# Patient Record
Sex: Female | Born: 1935 | Race: White | Hispanic: No
Health system: Southern US, Community
[De-identification: ages and names within clinical notes are randomized; demographics above are authoritative.]

---

## 1998-02-16 ENCOUNTER — Ambulatory Visit (HOSPITAL_COMMUNITY): Admission: RE | Admit: 1998-02-16 | Discharge: 1998-02-16 | Payer: Self-pay | Admitting: Specialist

## 1998-03-18 ENCOUNTER — Ambulatory Visit (HOSPITAL_COMMUNITY): Admission: RE | Admit: 1998-03-18 | Discharge: 1998-03-18 | Payer: Self-pay | Admitting: Specialist

## 2003-04-09 ENCOUNTER — Other Ambulatory Visit: Payer: Self-pay

## 2003-04-17 ENCOUNTER — Inpatient Hospital Stay (HOSPITAL_COMMUNITY)
Admission: AD | Admit: 2003-04-17 | Discharge: 2003-05-13 | Payer: Self-pay | Admitting: Physical Medicine & Rehabilitation

## 2005-02-22 ENCOUNTER — Other Ambulatory Visit: Payer: Self-pay

## 2005-02-22 ENCOUNTER — Inpatient Hospital Stay: Payer: Self-pay | Admitting: Internal Medicine

## 2005-02-23 ENCOUNTER — Other Ambulatory Visit: Payer: Self-pay

## 2005-02-24 ENCOUNTER — Other Ambulatory Visit: Payer: Self-pay

## 2012-07-27 ENCOUNTER — Ambulatory Visit: Payer: Self-pay | Admitting: Orthopedic Surgery

## 2012-07-28 ENCOUNTER — Inpatient Hospital Stay: Payer: Self-pay

## 2012-07-28 LAB — URINALYSIS, COMPLETE
Ketone: NEGATIVE
Nitrite: POSITIVE
Ph: 6 (ref 4.5–8.0)
Protein: NEGATIVE
RBC,UR: 3 /HPF (ref 0–5)
WBC UR: 12 /HPF (ref 0–5)

## 2012-07-28 LAB — COMPREHENSIVE METABOLIC PANEL
Alkaline Phosphatase: 97 U/L (ref 50–136)
Anion Gap: 10 (ref 7–16)
BUN: 26 mg/dL — ABNORMAL HIGH (ref 7–18)
Bilirubin,Total: 0.2 mg/dL (ref 0.2–1.0)
Calcium, Total: 9.1 mg/dL (ref 8.5–10.1)
Co2: 24 mmol/L (ref 21–32)
EGFR (Non-African Amer.): 43 — ABNORMAL LOW
Potassium: 3.7 mmol/L (ref 3.5–5.1)
SGPT (ALT): 16 U/L (ref 12–78)
Sodium: 137 mmol/L (ref 136–145)
Total Protein: 7 g/dL (ref 6.4–8.2)

## 2012-07-28 LAB — TROPONIN I: Troponin-I: <0.02 ng/mL

## 2012-07-28 LAB — PROTIME-INR
INR: 1
Prothrombin Time: 13 secs (ref 11.5–14.7)

## 2012-07-28 LAB — CBC
HGB: 12 g/dL (ref 12.0–16.0)
MCH: 29.3 pg (ref 26.0–34.0)
MCHC: 34 g/dL (ref 32.0–36.0)
RBC: 4.11 10*6/uL (ref 3.80–5.20)
RDW: 14.8 % — ABNORMAL HIGH (ref 11.5–14.5)
WBC: 8.1 10*3/uL (ref 3.6–11.0)

## 2012-07-31 LAB — PATHOLOGY REPORT

## 2012-07-31 LAB — POTASSIUM: Potassium: 4.1 mmol/L (ref 3.5–5.1)

## 2013-05-15 ENCOUNTER — Ambulatory Visit: Payer: Self-pay

## 2013-06-12 DEATH — deceased

## 2014-06-08 IMAGING — CT CT ANGIO CHEST
2 of 6 series · 18 of 36 positions shown · IV contrast (APPLIED)
Comparison: Chest CTA 02/22/2005.

CLINICAL DATA: [AGE] female with chest mass, pulmonary
nodules. Initial encounter.

EXAM:
CT ANGIOGRAPHY CHEST WITH CONTRAST
TECHNIQUE: Multidetector CT imaging of the chest was performed using the
standard protocol during bolus administration of intravenous
contrast. Multiplanar CT image reconstructions and MIPs were
obtained to evaluate the vascular anatomy.
CONTRAST:  75 mL Isovue 370.

[Series 6: pe 1.0 thins · axial · 0.79mm/px · z∈[-584,-300]mm · 17 of 320 slices shown]
[im 18/320  lung]
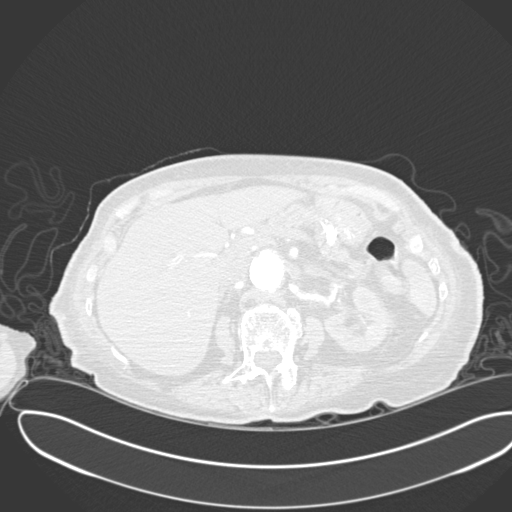
[im 36/320  mediastinal]
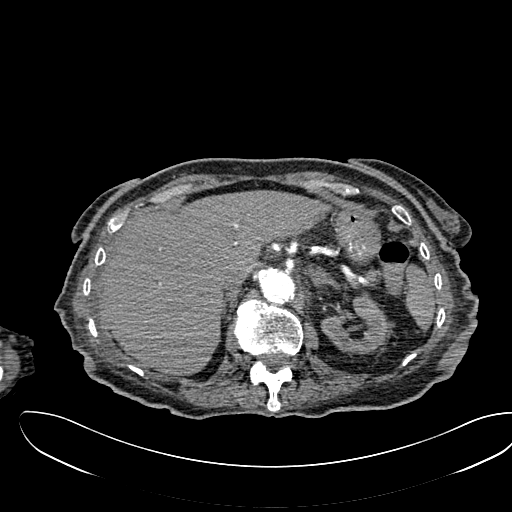
[im 54/320  lung]
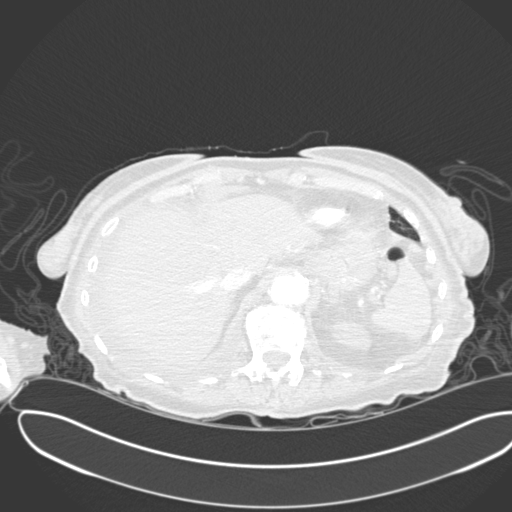
[im 71/320  mediastinal]
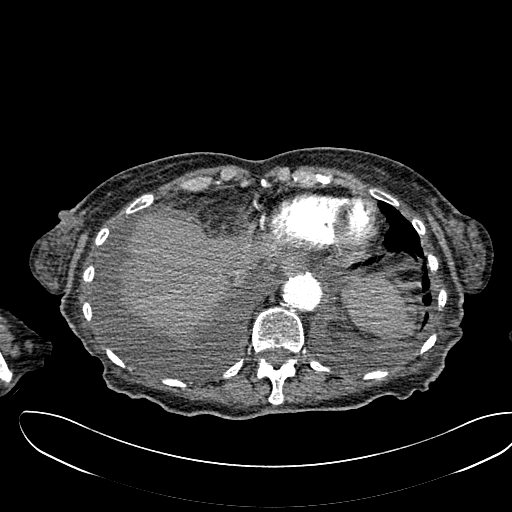
[im 89/320  lung]
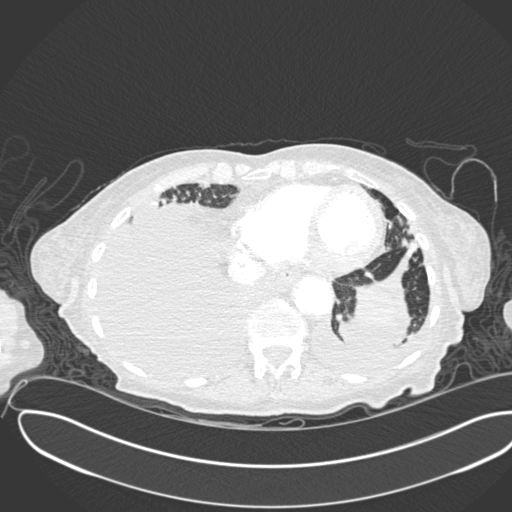
[im 107/320  mediastinal]
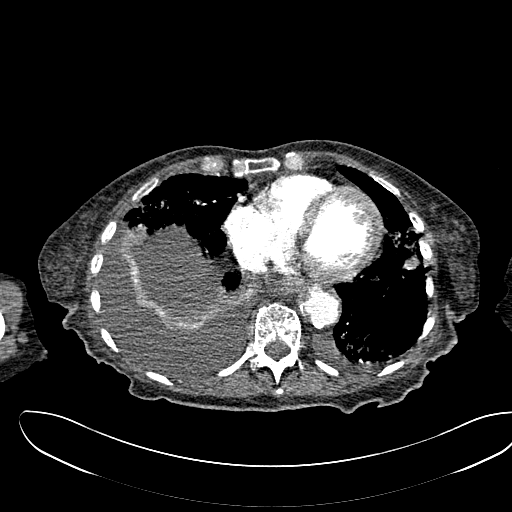
[im 125/320  lung]
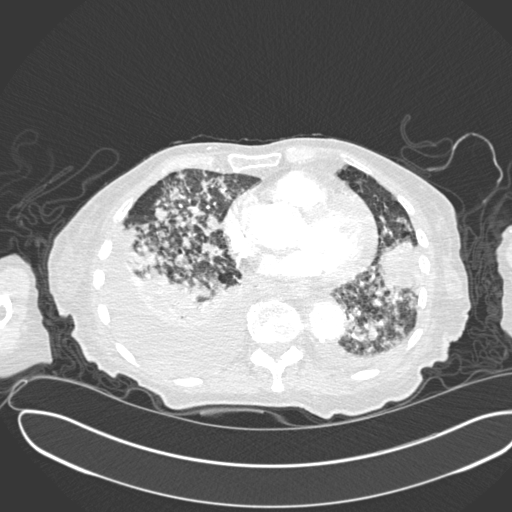
[im 142/320  mediastinal]
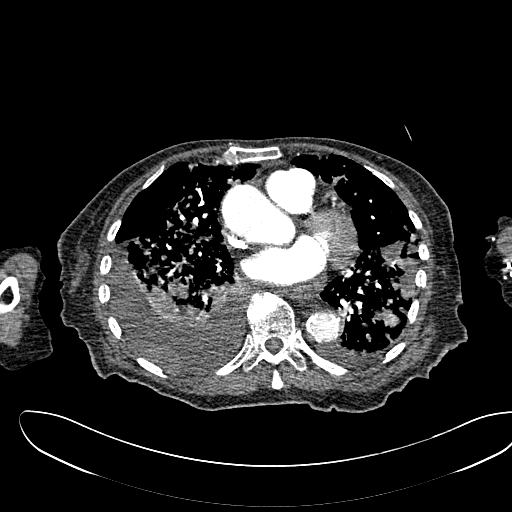
[im 160/320  lung]
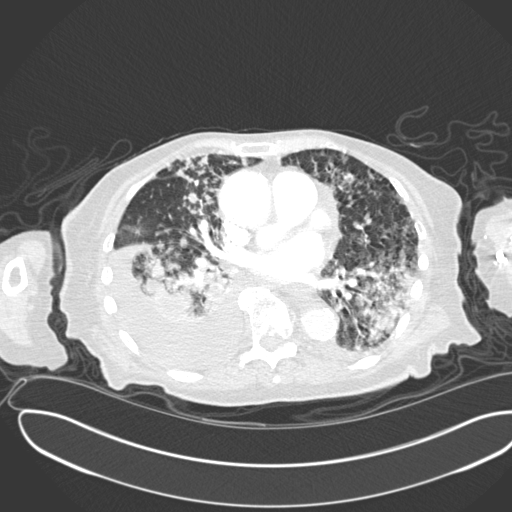
[im 178/320  mediastinal]
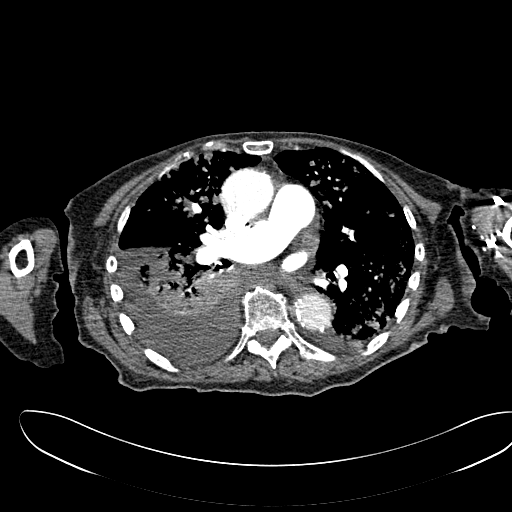
[im 195/320  lung]
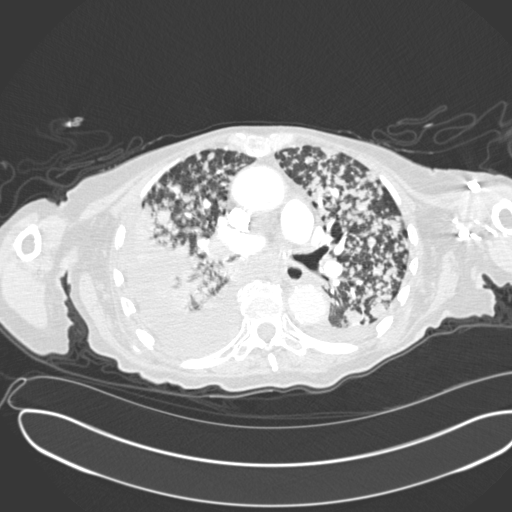
[im 213/320  mediastinal]
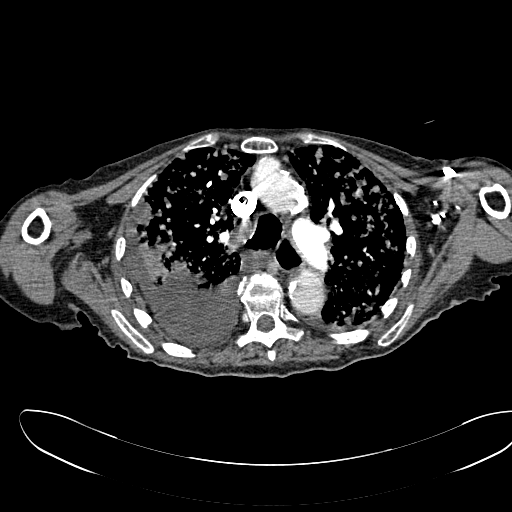
[im 231/320  lung]
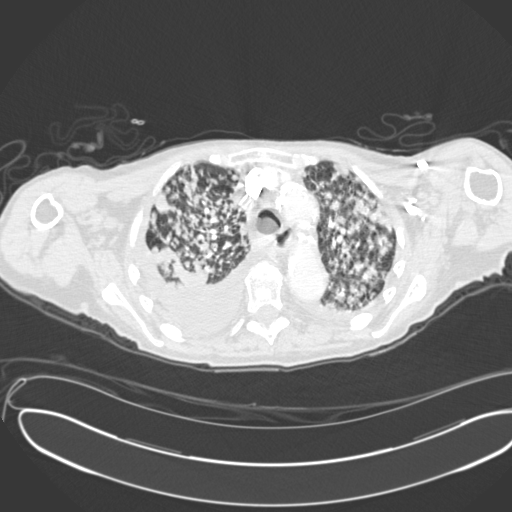
[im 249/320  mediastinal]
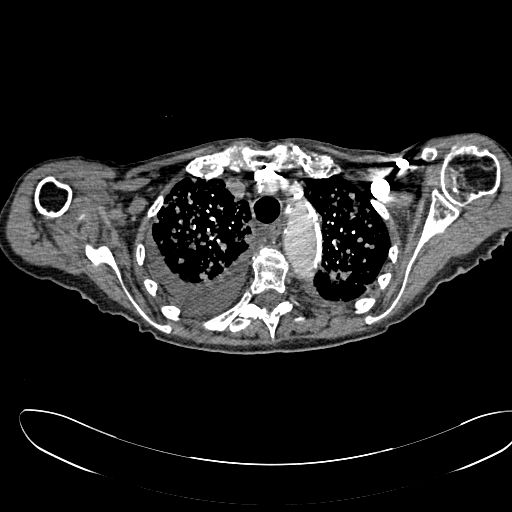
[im 266/320  lung]
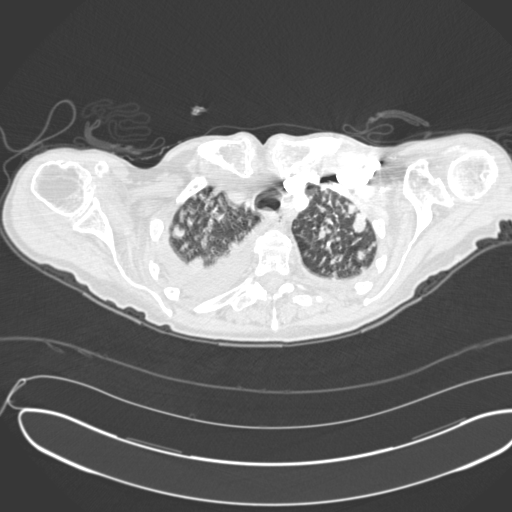
[im 284/320  mediastinal]
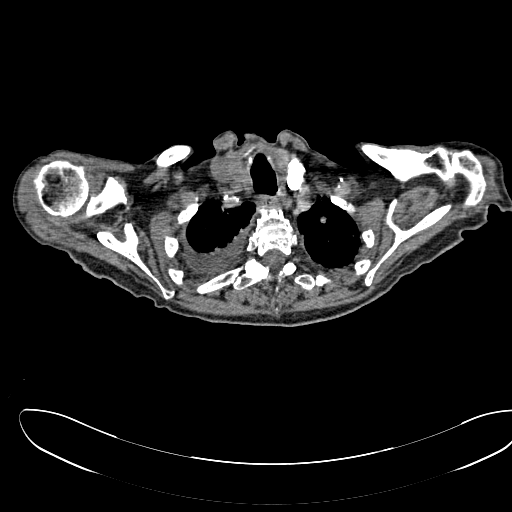
[im 302/320  lung]
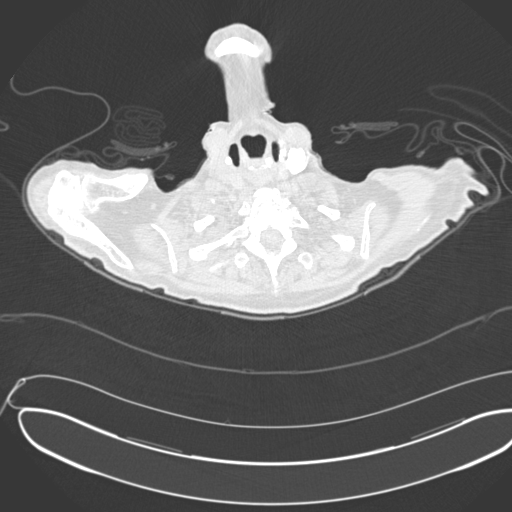

[Series 8: cor pe 2.0 mpr · coronal · 0.73mm/px · 1 of 117 slices shown]
[im 59/117  mediastinal]
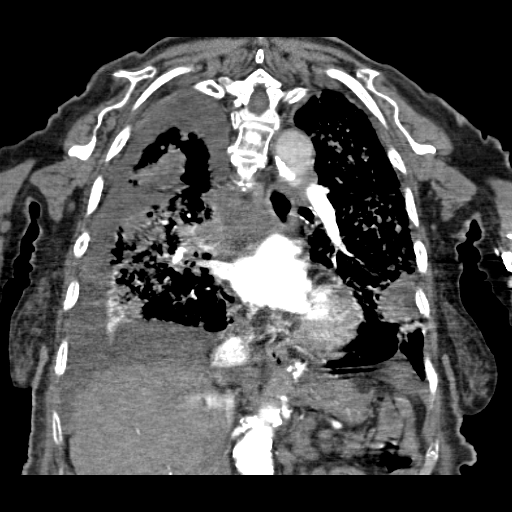

[18 of 36 positions shown; findings below may reference images not displayed]

FINDINGS: Good contrast bolus timing in the pulmonary arterial tree.

No focal filling defect identified in the pulmonary arterial tree to
suggest the presence of acute pulmonary embolism.

Large layering right pleural effusion. Subtle 3 cm pleural based
mass posteriorly at the mid lung level (series 5, image 95). Due to
the effusion on the right, subtotal right lung collapse. Small to
moderate layering left pleural effusion as well.

Superimposed too numerous to count diffuse pulmonary nodules and
soft tissue masses. These are confluent in areas (up to 4-5 cm at
the left lung base, series 7, image 94).

Superimposed atelectatic changes on the central airways.

Suspect subcarinal mediastinal lymphadenopathy. Mildly increased
right hilar lymph nodes. No definite mediastinal or hilar adenopathy
elsewhere. No pericardial effusion.

No lymphadenopathy at the thoracic inlet. Widespread atherosclerosis
of the aorta. Infrarenal abdominal aortic aneurysm partially
visible, visualized portion up to 38 mm diameter.

No axillary lymphadenopathy.

Except for suspected additional right pleural based tumor along the
surface of the right hemidiaphragm, no mass or tumor identified in
the visible upper abdomen.

No acute or suspicious osseous lesion identified.

Review of the MIP images confirms the above findings.
IMPRESSION: 1. Advanced thoracic malignancy in the form of innumerable pulmonary
metastases, unknown primary. Pleural based metastases suspected on
the right with subsequent large right pleural effusion. Some hilar
and mediastinal lymphadenopathy.
2. Right lower lung collapse related to the large right effusion.
Moderate layering left pleural effusion.
3. No pulmonary embolus.
4. Incidental findings including partially visible AAA.
Study discussed by telephone with nurse practitioner Shukria Mtm

## 2014-07-04 NOTE — Op Note (Signed)
PATIENT NAME:  Susan Washington, Susan Washington MR#:  [id number] DATE OF BIRTH:  09-Sep-1917  DATE OF PROCEDURE:  07/28/2012  PREOPERATIVE DIAGNOSIS: Right displaced femoral neck fracture.   POSTOPERATIVE DIAGNOSIS: Right displaced femoral neck fracture.   PROCEDURE PERFORMED: Right hip hemiarthroplasty.   SURGEON OF RECORD: Dr. Thornton Papas    ANESTHESIA: General endotracheal tube anesthesia.   Preoperative Antibiotics: 1 gram Ancef.   FLUIDS ADMINISTERED: 1200 crystalloid.   ESTIMATED BLOOD LOSS: 250 mL.   COMPLICATIONS: None.   DISPOSITION: Stable to PACU upon transport.   INDICATIONS FOR PROCEDURE: This is a 81 year old nursing home female who is a moderate household ambulator with walker who fell and sustained a right femoral neck fracture. The patient and her son were counseled regarding risks and benefits of the procedure, the risks to include but not limited to bleeding, infection, damage to sooner and/or vessels, dislocation, fracture, need for additional surgical procedures. They agreed to proceed with surgery after weighing all these risks.   DESCRIPTION OF PROCEDURE: The patient was brought to the operating room and placed on the operating room table in the lateral position on a pegboard. X-roll was placed and all extremities were appropriately padded. The right lower extremity was then prepped and draped in a sterile fashion. A timeout was then performed between the anesthesia, nursing staff and surgeon of record to confirm surgical site, patient, medical record number, date of birth, laterality, procedure to be performed, confirmation of administration of antibiotics, confirmation all equipment was in the room. We next began making a posterior approach to the right hip with dissection down to the level of the fascia. The fascia was incised, then blunt dissection down to the level of the piriformis and short external rotators. These were reflected off their insertion and tagged with Ethibond  suture for later repair. With a Cobra retractor underneath the hip abductors, the capsulotomy was performed and this was also tagged with an Ethibond suture for later repair. At this point, the neck cut was revised to facilitate removal of the femoral head. The head was removed and measured to be approximately 45 mm in diameter. We trialed the 45 head in the socket and we were happy with the fit. We next used our canal entry reamer to find the canal and then used the lateralizer to help lateralize our trajectory. We then began sequentially broaching starting with a size 1 but progressing to a size 3 and had a good stable fit. We used the calcar planer to plane down her neck cut and then trialed with a 45 + 0. This was felt to be slightly loose, so we trialed with a 45 + 5 and we were happy with both range of motion and stability. The 3 broach was then removed. The cement restrictor size 3 was placed at the appropriate depth in the canal and the canal was cleaned out with combination of pulse irrigation and femoral brush to cleanse the canal. At this point, the cement was prepared and then injected down the canal with the assistance of the cement pressurizer. We then inserted our size 3 stem, removed the excess cement, and waited approximately 17 minutes for the cement to cure. At this point, we once again trialed the 45 + 5 and were happy with both range of motion and stability. Final head implant was then placed and secured with the [facility] taper. The hip was reduced and taken through range of motion, again confirming stability and motion. The wound was then copiously irrigated  with sterile saline and the capsule was repaired through a drill hole drill hole in the greater trochanter using #5 Ethibond suture. The piriformis short external rotators were also brought up to the insertion of the abductors to augment the posterior repair. The hip was maintained in an abducted position on a padded Mayo stand to during the  entire posterior closure repair. We next used 0 Vicryl to close the fascia followed by 2-0 Vicryl and then staples in the skin. Sterile dressing was applied and patient was placed on an abduction pillow and transferred to PACU in stable condition.    ____________________________ Danelle Earthlyobin T. Shandora Koogler, MD tte:aw D: 07/28/2012 13:46:06 ET T: 07/29/2012 07:52:31 ET JOB#: [id number]  cc: Danelle Earthlyobin T. Shrinika Blatz, MD, <Dictator> Danelle EarthlyBIN T Ronasia Isola MD ELECTRONICALLY SIGNED 07/30/2012 21:00

## 2014-07-04 NOTE — Discharge Summary (Signed)
PATIENT NAME:  Susan Washington, Susan Washington MR#:  [id number] DATE OF BIRTH:  06-21-1917  DATE OF ADMISSION:  07/28/2012 DATE OF DISCHARGE:    ADMITTING DIAGNOSIS: Right displaced femoral neck fracture.   DISCHARGE DIAGNOSIS: Right displaced femoral neck fracture.   PROCEDURE: Right hip hemiarthroplasty.   SURGEON: Dr. Thornton Papas.   ANESTHESIA: General endotracheal tube anesthesia.   PREOPERATIVE ANTIBIOTICS: 1 gram Ancef.   IV FLUIDS: 1200 crystalloid.   ESTIMATED BLOOD LOSS: 250 mL.   COMPLICATIONS: None.   DISPOSITION: Stable to PACU upon transport.   HISTORY: The patient is a 81 year old nursing home resident. He is a minimal ambulator with walker. He fell and sustained a right femoral neck fracture.   PHYSICAL EXAMINATION:  RESPIRATORY: Normal respiratory effort.  CARDIOLOGY: Regular rate.  EXTREMITIES: Right lower extremity shortened and externally rotated.   HOSPITAL COURSE: The patient was admitted to the hospital on 07/28/2012. She had surgery that same day and was brought to the orthopedic floor from the PACU in stable condition. On postop day 1, the patient was doing well. Vital signs and labs were stable. On postop day 2, she continued to be stable. She did have an increased potassium of 5.2 on postop day 2. On postop day 2, fluids and potassium supplements were stopped and she was found to have a normal potassium on postop day 3. The patient's labs remained stable. She had slow progress with physical therapy. She was ready for discharge to a rehab facility.   DISCHARGE INSTRUCTIONS:  1.  The patient should increase weight-bearing on the affected extremity. She is to wear thigh-high TED hose on both legs and remove at bedtime and replace when arising the next morning. 2.  Elevate the heels off the bed.  3.  She may resume a regular diet as tolerated.  4.  She is to apply an ice pack to the affected area.  5.  Do not get the dressing bandage wet or dirty.  6.  Call [facility] at the  office if the dressing gets water under it. Leave the dressing on.  7.  Remove staples and apply benzoin Steri-Strips on 08/14/2012.  8.  Call [facility] if any of the following occur such as bright red bleeding from the incision wound, fever above 101.5 degrees, redness or swelling, or drainage at the incision.  9.  Call [facility] if you experience any increased leg pain, numbness or weakness in legs or bowel or bladder symptoms.  10. The patient is referred physical therapy and occupational therapy at the rehab facility. She should have staples removed on 08/14/2012 at the rehab facility. 11. Call [facility] for followup appointment at [phone]. She should have appointment in 6 weeks.  12. Followup physician, Kennedy Bucker, M.D.   DISCHARGE MEDICATIONS: Remeron 30 mg 1 tablet orally once a day, Aggrenox one cap orally 2 times a day, atorvastatin 10 mg oral tablet 1 tablet orally once a day at bedtime, Diovan 80 mg oral tablet 1 tablet orally once a day, Effexor 37.5 mg oral capsule extended release 1 cap orally once a day, furosemide 20 mg oral tablet 1 tablet orally once a day, isosorbide mononitrate 20 mg oral tablet 1 tablet orally once a day, metoprolol succinate 50 mg oral tablet extended release 1 tablet orally once a day, Prilosec 20 mg oral delayed-release capsule one cap orally once a day, Senokot plus 2 tabs orally 2 times a day, Claritin 10 mg oral tablet 1 tab orally once a day  as needed, Xanax 0.25 mg oral tablet 1 tablet orally every 8 hours as needed for anxiety and nervousness, erythromycin ophthalmic 0.5% ophthalmic ointment one application to each affected eye once a day at bedtime, D3 50, 50,000 international units oral capsule one cap orally once a month, mirtazapine 50 mg oral tablet 1 tablet orally once a day at bedtime, albuterol 2.5 mg every 4 hours as needed for shortness of breath, senna 2 tabs orally 3 times a day, bisacodyl 5 mg oral delayed release tablet 2 tabs orally once a day at  bedtime, polyethylene glycol 350 oral powder for reconstitution 17 grams orally once a day as needed for constipation, erythromycin 1 application every 24 hours and Lovenox 30 mg subcutaneous once a day for 14 days.  ____________________________ Evon Slackhomas C. Spiros Greenfeld, PA-C tcg:aw D: 07/31/2012 10:11:28 ET T: 07/31/2012 10:23:40 ET JOB#: [id number]  cc: Evon Slackhomas C. Felicidad Sugarman, PA-C, <Dictator> Evon SlackHOMAS C Angee Gupton GeorgiaPA ELECTRONICALLY SIGNED 08/19/2012 2:37

## 2014-07-04 NOTE — H&P (Signed)
   Subjective/Chief Complaint Right hip pain   History of Present Illness 81 y/o nursing home resident who is a minimal ambulator with walker who fell and sustained a right femoral neck fracture.   Past History CVA   Past Med/Surgical Hx:  CVA/Stroke:   ALLERGIES:  Quinolones (fluoroquinolone) antibiotics: Unknown  Family and Social History:  Family History Non-Contributory   Place of Living Nursing Home   Review of Systems:  Subjective/Chief Complaint hip pain   Fever/Chills No   Abdominal Pain No   Chest Pain No   Physical Exam:  RESP normal resp effort   CARD regular rate   EXTR RLE shortened, externally rotated    Assessment/Admission Diagnosis Right femoral neck fracture   Plan Admit and obtain medical clearance/optimization Plan for hemiarthroplasty when medically cleared   Electronic Signatures: Danelle EarthlyEckel, Tobin T (MD)  (Signed 17-May-14 06:00)  Authored: CHIEF COMPLAINT and HISTORY, PAST MEDICAL/SURGIAL HISTORY, ALLERGIES, FAMILY AND SOCIAL HISTORY, REVIEW OF SYSTEMS, PHYSICAL EXAM, ASSESSMENT AND PLAN   Last Updated: 17-May-14 06:00 by Danelle EarthlyEckel, Tobin T (MD)

## 2014-07-04 NOTE — Consult Note (Signed)
PATIENT NAME:  Susan Washington, MORT MR#:  [id number] DATE OF BIRTH:  06/01/1917  DATE OF CONSULTATION:  07/28/2012  REFERRING PHYSICIAN: Danelle Earthly, MD  CONSULTING PHYSICIAN:  Susa Griffins, MD  PRIMARY CARE PHYSICIAN: The patient lives in a nursing home.   REASON FOR CONSULTATION: Preoperative clearance for surgery.  HISTORY OF PRESENT ILLNESS: Ms. Skillman is a 81 year old pleasant white female with a past medical history of CVA, congestive heart failure, previous history of non-Q-wave MI, hypertension, hyperlipidemia, cardiomyopathy, who presented to the Emergency Department with complaints of left hip pain after having a fall. The patient states was sleeping, rolled out of bed and fell on the left hip. Started to experience severe pain. Workup in the Emergency Department showed right femoral neck fracture. The patient has history of CVA with residual weakness. The patient has some slurred speech. Denies having any chest pain, palpitations. Denies having any shortness of breath with exertion. The patient usually walks with the help of a walker to the dining room. Dependent on all the ADLs.    PAST MEDICAL HISTORY:  1. Hypertension.  2. Hyperlipidemia.  3. History of cardiomyopathy.  4. Congestive heart failure.  5. Depression.  6. History of CVA with the residual hemiparesis on the right.   PAST SURGICAL HISTORY: Tonsillectomy.   ALLERGIES: QUINOLONE.   HOME MEDICATIONS:  1. Sorbitol 30 mL orally as needed.  2. Senokot 1 tablet 2 times a day.  3. Ritalin 10 mg once a day.  4. Remeron 30 mg once a day. 5. Multivitamin 1 tablet once a day.  6. Lacri-Lube apply to the right eye.  7. Hydrochlorothiazide 12.5 mg daily.  8. Crestor 10 mg daily.  9. Avapro 300 mg daily.  10. Aspirin 325 mg daily.   SOCIAL HISTORY: No history of smoking, drinking alcohol or using illicit drugs.   FAMILY HISTORY: Noncontributory.    REVIEW OF SYSTEMS:  CONSTITUTIONAL: No generalized weakness.   EYES: No change in vision.  ENT: No change in any hearing. RESPIRATORY: No cough or shortness of breath.  CARDIOVASCULAR: No chest pain, palpitations or shortness of breath. No lower extremity swelling.  GASTROINTESTINAL: Has good appetite and having regular bowel movements.  GENITOURINARY: No dysuria or hematuria.  SKIN: No rash or lesions.  MUSCULOSKELETAL: Has generalized body aches, takes Tylenol as needed.  NEUROLOGIC: No weakness or numbness.   PHYSICAL EXAMINATION:  GENERAL: This is a well-built, well-nourished, age-appropriate female lying down in the bed, not in distress.  VITAL SIGNS: Temperature 98, pulse 84, blood pressure 180/73, respiratory rate of 16, oxygen saturation is 95% on room air.  HEENT: Head normocephalic, atraumatic. Eyes: No scleral icterus. Conjunctivae normal. Pupils equal and reactive to light. Extraocular movements are intact. Mucous membranes: Mild dryness. NECK: Supple. No lymphadenopathy. No JVD. No carotid bruit. No thyromegaly.  CHEST: Has no focal tenderness. LUNGS: Bilaterally clear to auscultation.  HEART: S1 and S2 regular. No murmurs are heard. No pedal edema. Pulses 2+.  ABDOMEN: Bowel sounds present. Soft, nontender, nondistended. No hepatosplenomegaly.  SKIN: No rash or lesions.  MUSCULOSKELETAL: Has right leg shortened, externally rotated.  NEUROLOGIC: The patient is alert, oriented to place, person and time. Cranial nerves II through XII intact. Motor 5/5 in upper and lower extremities.   LABORATORY DATA: CMP is completely within normal limits. CBC is completely within normal limits. PT 30. INR 1. Troponin less than 0.02.   ELECTROCARDIOGRAM, 12-LEAD: Poor R wave progression in the anterior leads, atrial fibrillation with premature aberrantly conducted  complexes.   ASSESSMENT AND PLAN: Ms. Thurmond ButtsWade is a 81 year old female who comes to the Emergency Department after having a fall, sustained a right hip fracture.   1. Right hip fracture.  Considering the patient's poor functional status, the patient meets 1 to 2 METs. The patient does not have any chest pain at baseline. No shortness of breath. However, the patient has history of cerebrovascular accident. The patient meets more than 2 risks for this surgery, which gives the patient a 1.3% to 3.5% risk for the perioperative surgery on the revised cardiac index score. However, the patient, in order to re-establish her functional status, will allow her to undergo surgery and followup. Early ambulation. Discontinue the Foley catheter at the earliest. Continue with incentive spirometry to prevent the atelectasis and pneumonia.  2. History of cerebrovascular accident. No current issues.  3. History of coronary artery disease. The patient is currently not on any medications.  4. Hypertension. Will hold the hydrochlorothiazide.  5. Keep the patient on deep vein thrombosis prophylaxis with Lovenox.   TIME SPENT: 45 minutes.   ____________________________ Susa GriffinsPadmaja Charizma Gardiner, MD pv:OSi D: 07/28/2012 07:12:28 ET T: 07/28/2012 08:06:49 ET JOB#: [id number]  cc: Susa GriffinsPadmaja Harleigh Civello, MD, <Dictator> Clerance LavPADMAJA Niyana Chesbro MD ELECTRONICALLY SIGNED 08/01/2012 0:10
